# Patient Record
Sex: Female | Born: 1965 | Race: White | Hispanic: Yes | Marital: Married | Smoking: Never smoker
Health system: Southern US, Community
[De-identification: ages and names within clinical notes are randomized; demographics above are authoritative.]

## PROBLEM LIST (undated history)

## (undated) DIAGNOSIS — E119 Type 2 diabetes mellitus without complications: Secondary | ICD-10-CM

## (undated) DIAGNOSIS — D649 Anemia, unspecified: Secondary | ICD-10-CM

## (undated) HISTORY — DX: Anemia, unspecified: D64.9

## (undated) HISTORY — PX: OTHER SURGICAL HISTORY: SHX169

## (undated) HISTORY — DX: Type 2 diabetes mellitus without complications: E11.9

## (undated) HISTORY — PX: CHOLECYSTECTOMY: SHX55

---

## 2004-03-30 ENCOUNTER — Ambulatory Visit: Payer: Self-pay | Admitting: Internal Medicine

## 2008-05-24 ENCOUNTER — Ambulatory Visit: Payer: Self-pay | Admitting: Family Medicine

## 2008-06-06 ENCOUNTER — Ambulatory Visit: Payer: Self-pay | Admitting: Family Medicine

## 2014-01-24 DIAGNOSIS — S62639D Displaced fracture of distal phalanx of unspecified finger, subsequent encounter for fracture with routine healing: Secondary | ICD-10-CM | POA: Insufficient documentation

## 2014-12-07 DIAGNOSIS — H11002 Unspecified pterygium of left eye: Secondary | ICD-10-CM | POA: Insufficient documentation

## 2015-12-12 ENCOUNTER — Inpatient Hospital Stay: Payer: Self-pay | Admitting: Internal Medicine

## 2016-01-03 ENCOUNTER — Encounter: Payer: Self-pay | Admitting: Internal Medicine

## 2016-01-03 ENCOUNTER — Inpatient Hospital Stay: Payer: BLUE CROSS/BLUE SHIELD | Attending: Internal Medicine | Admitting: Internal Medicine

## 2016-01-03 ENCOUNTER — Encounter (INDEPENDENT_AMBULATORY_CARE_PROVIDER_SITE_OTHER): Payer: Self-pay

## 2016-01-03 DIAGNOSIS — E119 Type 2 diabetes mellitus without complications: Secondary | ICD-10-CM | POA: Insufficient documentation

## 2016-01-03 DIAGNOSIS — D500 Iron deficiency anemia secondary to blood loss (chronic): Secondary | ICD-10-CM | POA: Insufficient documentation

## 2016-01-03 DIAGNOSIS — Z7984 Long term (current) use of oral hypoglycemic drugs: Secondary | ICD-10-CM | POA: Insufficient documentation

## 2016-01-03 DIAGNOSIS — N920 Excessive and frequent menstruation with regular cycle: Secondary | ICD-10-CM | POA: Diagnosis not present

## 2016-01-03 DIAGNOSIS — R109 Unspecified abdominal pain: Secondary | ICD-10-CM

## 2016-01-03 NOTE — Assessment & Plan Note (Addendum)
#   likely secondary to heavy periods. Hemoglobin between 8-9 asymptomatic. Ferritin 7; saturations 3%. Recommend IV Venofer weekly 4 discussed the potential reactions.  # Abdominal pain ? / History of menorrhagia deferred to PCP for further workup.   # Labs in 2 Months/ possible venofer in 2 months; Follow up labs/MD/possible venofer in 4 months; labs I week prior.  Thank you Dr. Cathie HoopsYu for allowing me to participate in the care of your pleasant patient. Please do not hesitate to contact me with questions or concerns in the interim.

## 2016-01-03 NOTE — Progress Notes (Signed)
[facility] CONSULT NOTE  Patient Care Team: Rupal Doreatha LewLakhani Yu, MD as PCP - General (Family Medicine)  CHIEF COMPLAINTS/PURPOSE OF CONSULTATION:   # IRON DEFICIENCY ANEMIA [hb- 8-9] sat- 3%; Ferritin- 7; stool occult-NEG  # heavy menstrual periods   No history exists.     HISTORY OF PRESENTING ILLNESS:  Megan Maxwell 50 y.o.  female has been referred to us for further evaluation and recommendations for severe anemia.  Patient admits to chronic anemia; has chronic heavy menstrual periods. Recently started on birth control pills- with some control of the heavy periods. Denies any weight loss. Denies any difficult swallowing. Denies any blood in urine. Denies blood in stools.  Patient has intermittent abdominal pain; around the time of her menstrual periods. No abdominal distention. She is awaiting an ultrasound thru her PCP.   ROS: A complete 10 point review of system is done which is negative except mentioned above in history of present illness  MEDICAL HISTORY:  Past Medical History:  Diagnosis Date  . Anemia   . Diabetes mellitus without complication (HCC)     SURGICAL HISTORY: Past Surgical History:  Procedure Laterality Date  . c-section x2  1991/2001    SOCIAL HISTORY: [employer] and you a [employer]; [place]; no smoking/ no alcohol.  Social History   Social History  . Marital status: Married    Spouse name: N/A  . Number of children: N/A  . Years of education: N/A   Occupational History  . Not on file.   Social History Main Topics  . Smoking status: Not on file  . Smokeless tobacco: Not on file  . Alcohol use Not on file  . Drug use: Unknown  . Sexual activity: Not on file   Other Topics Concern  . Not on file   Social History Narrative  . No narrative on file    FAMILY HISTORY: grandma/ paternal- uterine ca;  No family history on file.  ALLERGIES:  has No Known Allergies.  MEDICATIONS:  Current Outpatient Prescriptions   Medication Sig Dispense Refill  . Cholecalciferol (VITAMIN D3) 2000 units TABS Take by mouth.    . ferrous sulfate 325 (65 FE) MG tablet Take 325 mg by mouth.    Marland Kitchen. ibuprofen (ADVIL,MOTRIN) 800 MG tablet   1  . metFORMIN (GLUCOPHAGE) 500 MG tablet Take 500 mg by mouth.     No current facility-administered medications for this visit.       Marland Kitchen.  PHYSICAL EXAMINATION:   Vitals:   01/03/16 1555  BP: 112/75  Pulse: 61  Resp: 18  Temp: 98 F (36.7 C)   Filed Weights   01/03/16 1555  Weight: 185 lb 1 oz (83.9 kg)    GENERAL: Well-nourished well-developed; Alert, no distress and comfortable.   Accompanied by daughter/ interpreter.  EYES: no icterus; positive for pallor.  OROPHARYNX: no thrush or ulceration; good dentition  NECK: supple, no masses felt LYMPH:  no palpable lymphadenopathy in the cervical, axillary or inguinal regions LUNGS: clear to auscultation and  No wheeze or crackles HEART/CVS: regular rate & rhythm and no murmurs; No lower extremity edema ABDOMEN: abdomen soft, non-tender and normal bowel sounds Musculoskeletal:no cyanosis of digits and no clubbing  PSYCH: alert & oriented x 3 with fluent speech NEURO: no focal motor/sensory deficits SKIN:  no rashes or significant lesions  LABORATORY DATA:  I have reviewed the data as listed No results found for: WBC, HGB, HCT, MCV, PLT No results for input(s): NA,  K, CL, CO2, GLUCOSE, BUN, CREATININE, CALCIUM, GFRNONAA, GFRAA, PROT, ALBUMIN, AST, ALT, ALKPHOS, BILITOT, BILIDIR, IBILI in the last 8760 hours.  RADIOGRAPHIC STUDIES: I have personally reviewed the radiological images as listed and agreed with the findings in the report. No results found.  ASSESSMENT & PLAN:   Iron deficiency anemia due to chronic blood loss # likely secondary to heavy periods. Hemoglobin between 8-9 asymptomatic. Ferritin 7; saturations 3%. Recommend IV Venofer weekly 4 discussed the potential reactions.  # Abdominal pain ? / History  of menorrhagia deferred to PCP for further workup.   # Labs in 2 Months/ possible venofer in 2 months; Follow up labs/MD/possible venofer in 4 months; labs I week prior.  Thank you Dr. Cathie Hoops for allowing me to participate in the care of your pleasant patient. Please do not hesitate to contact me with questions or concerns in the interim.    All questions were answered. The patient knows to call the clinic with any problems, questions or concerns.     Earna Coder, MD 01/03/2016 4:59 PM

## 2016-01-03 NOTE — Progress Notes (Signed)
Patient states she gets pain in upper gastric area that radiates into her back sometimes after she eats.  She also states she gets right sided abdominal pain on occasion.  Patient here as new evaluation regarding anemia.  [facility].

## 2016-01-05 ENCOUNTER — Encounter (INDEPENDENT_AMBULATORY_CARE_PROVIDER_SITE_OTHER): Payer: Self-pay

## 2016-01-05 ENCOUNTER — Inpatient Hospital Stay: Payer: BLUE CROSS/BLUE SHIELD | Attending: Internal Medicine

## 2016-01-05 VITALS — BP 108/72 | HR 67 | Temp 97.2°F | Resp 12

## 2016-01-05 DIAGNOSIS — Z79899 Other long term (current) drug therapy: Secondary | ICD-10-CM | POA: Insufficient documentation

## 2016-01-05 DIAGNOSIS — D500 Iron deficiency anemia secondary to blood loss (chronic): Secondary | ICD-10-CM | POA: Insufficient documentation

## 2016-01-05 MED ORDER — SODIUM CHLORIDE 0.9 % IV SOLN
200.0000 mg | Freq: Once | INTRAVENOUS | Status: AC
  Administered 2016-01-05: 200 mg via INTRAVENOUS
  Filled 2016-01-05: qty 10

## 2016-01-05 MED ORDER — SODIUM CHLORIDE 0.9 % IV SOLN
Freq: Once | INTRAVENOUS | Status: AC
  Administered 2016-01-05: 14:00:00 via INTRAVENOUS
  Filled 2016-01-05: qty 1000

## 2016-01-09 DIAGNOSIS — E119 Type 2 diabetes mellitus without complications: Secondary | ICD-10-CM | POA: Insufficient documentation

## 2016-01-09 DIAGNOSIS — G43009 Migraine without aura, not intractable, without status migrainosus: Secondary | ICD-10-CM | POA: Insufficient documentation

## 2016-01-12 ENCOUNTER — Inpatient Hospital Stay: Payer: BLUE CROSS/BLUE SHIELD

## 2016-01-12 DIAGNOSIS — D500 Iron deficiency anemia secondary to blood loss (chronic): Secondary | ICD-10-CM

## 2016-01-12 MED ORDER — SODIUM CHLORIDE 0.9 % IV SOLN
200.0000 mg | Freq: Once | INTRAVENOUS | Status: AC
  Administered 2016-01-12: 200 mg via INTRAVENOUS
  Filled 2016-01-12: qty 10

## 2016-01-12 MED ORDER — SODIUM CHLORIDE 0.9 % IV SOLN
Freq: Once | INTRAVENOUS | Status: AC
  Administered 2016-01-12: 15:00:00 via INTRAVENOUS
  Filled 2016-01-12: qty 1000

## 2016-01-19 ENCOUNTER — Ambulatory Visit: Payer: Self-pay

## 2016-01-19 ENCOUNTER — Inpatient Hospital Stay: Payer: BLUE CROSS/BLUE SHIELD

## 2016-01-19 VITALS — BP 106/70 | HR 73 | Temp 97.7°F | Resp 18

## 2016-01-19 DIAGNOSIS — D500 Iron deficiency anemia secondary to blood loss (chronic): Secondary | ICD-10-CM

## 2016-01-19 MED ORDER — SODIUM CHLORIDE 0.9 % IV SOLN
200.0000 mg | Freq: Once | INTRAVENOUS | Status: AC
  Administered 2016-01-19: 200 mg via INTRAVENOUS
  Filled 2016-01-19: qty 10

## 2016-01-19 MED ORDER — SODIUM CHLORIDE 0.9 % IV SOLN
Freq: Once | INTRAVENOUS | Status: AC
  Administered 2016-01-19: 14:00:00 via INTRAVENOUS
  Filled 2016-01-19: qty 1000

## 2016-01-26 ENCOUNTER — Inpatient Hospital Stay: Payer: BLUE CROSS/BLUE SHIELD

## 2016-01-26 VITALS — BP 110/69 | HR 76 | Resp 20

## 2016-01-26 DIAGNOSIS — D500 Iron deficiency anemia secondary to blood loss (chronic): Secondary | ICD-10-CM

## 2016-01-26 MED ORDER — SODIUM CHLORIDE 0.9 % IV SOLN
Freq: Once | INTRAVENOUS | Status: AC
  Administered 2016-01-26: 14:00:00 via INTRAVENOUS
  Filled 2016-01-26: qty 1000

## 2016-01-26 MED ORDER — SODIUM CHLORIDE 0.9 % IV SOLN
200.0000 mg | Freq: Once | INTRAVENOUS | Status: AC
  Administered 2016-01-26: 200 mg via INTRAVENOUS
  Filled 2016-01-26: qty 10

## 2016-03-01 ENCOUNTER — Inpatient Hospital Stay: Payer: BLUE CROSS/BLUE SHIELD | Attending: Internal Medicine

## 2016-03-01 ENCOUNTER — Inpatient Hospital Stay: Payer: BLUE CROSS/BLUE SHIELD

## 2016-04-26 ENCOUNTER — Other Ambulatory Visit: Payer: Self-pay

## 2016-05-03 ENCOUNTER — Inpatient Hospital Stay: Payer: BLUE CROSS/BLUE SHIELD

## 2016-05-03 ENCOUNTER — Other Ambulatory Visit: Payer: Self-pay

## 2016-05-10 ENCOUNTER — Ambulatory Visit: Payer: BLUE CROSS/BLUE SHIELD | Admitting: Oncology

## 2016-05-10 ENCOUNTER — Ambulatory Visit: Payer: BLUE CROSS/BLUE SHIELD

## 2016-10-01 DIAGNOSIS — K8020 Calculus of gallbladder without cholecystitis without obstruction: Secondary | ICD-10-CM | POA: Insufficient documentation

## 2017-08-01 ENCOUNTER — Other Ambulatory Visit: Payer: Self-pay | Admitting: Family Medicine

## 2017-08-01 DIAGNOSIS — Z1231 Encounter for screening mammogram for malignant neoplasm of breast: Secondary | ICD-10-CM

## 2018-03-18 ENCOUNTER — Other Ambulatory Visit: Payer: Self-pay | Admitting: Family Medicine

## 2018-03-18 DIAGNOSIS — Z1231 Encounter for screening mammogram for malignant neoplasm of breast: Secondary | ICD-10-CM

## 2019-09-01 ENCOUNTER — Ambulatory Visit: Payer: Self-pay

## 2019-09-01 ENCOUNTER — Other Ambulatory Visit: Payer: Self-pay

## 2019-09-01 DIAGNOSIS — Z021 Encounter for pre-employment examination: Secondary | ICD-10-CM

## 2019-09-01 LAB — POCT URINE DRUG SCREEN
POC Amphetamine UR: NOT DETECTED
POC Cocaine UR: NOT DETECTED
POC Methamphetamine UR: NOT DETECTED
POC Opiate Ur: NOT DETECTED
POC PHENCYCLIDINE UR: NOT DETECTED
URINE TEMPERATURE: 94 Degrees F (ref 90.0–100.0)

## 2019-09-06 NOTE — Addendum Note (Signed)
Addended by: Buzzy Han T on: 09/06/2019 09:12 AM   Modules accepted: Orders

## 2019-09-21 ENCOUNTER — Ambulatory Visit: Payer: Self-pay | Attending: Internal Medicine

## 2019-09-21 DIAGNOSIS — Z23 Encounter for immunization: Secondary | ICD-10-CM

## 2019-09-21 NOTE — Progress Notes (Signed)
   Covid-19 Vaccination Clinic  Name:  TANETTE CHAUCA    MRN: [id number] DOB: [birth year]  09/21/2019  Ms. Donahey was observed post Covid-19 immunization for 15 minutes without incident. She was provided with Vaccine Information Sheet and instruction to access the V-Safe system.   Ms. Ober was instructed to call 911 with any severe reactions post vaccine: Marland Kitchen Difficulty breathing  . Swelling of face and throat  . A fast heartbeat  . A bad rash all over body  . Dizziness and weakness   Immunizations Administered    Name Date Dose VIS Date Route   Pfizer COVID-19 Vaccine 09/21/2019  2:30 PM 0.3 mL 06/30/2018 Intramuscular   Manufacturer: [employer], [employer]   Lot: [id number]   NDC: [address]-6

## 2019-10-07 ENCOUNTER — Other Ambulatory Visit: Payer: Self-pay | Admitting: Family Medicine

## 2019-10-07 DIAGNOSIS — Z1231 Encounter for screening mammogram for malignant neoplasm of breast: Secondary | ICD-10-CM

## 2019-10-07 DIAGNOSIS — R1011 Right upper quadrant pain: Secondary | ICD-10-CM

## 2019-10-21 ENCOUNTER — Other Ambulatory Visit: Payer: Self-pay

## 2019-10-21 ENCOUNTER — Ambulatory Visit
Admission: RE | Admit: 2019-10-21 | Discharge: 2019-10-21 | Disposition: A | Discharge status: Home / Self Care | Payer: BLUE CROSS/BLUE SHIELD | Source: Ambulatory Visit | Attending: Family Medicine | Admitting: Family Medicine

## 2019-10-21 DIAGNOSIS — R1011 Right upper quadrant pain: Secondary | ICD-10-CM | POA: Diagnosis not present

## 2019-10-22 ENCOUNTER — Ambulatory Visit
Admission: RE | Admit: 2019-10-22 | Discharge: 2019-10-22 | Disposition: A | Discharge status: Home / Self Care | Payer: BLUE CROSS/BLUE SHIELD | Source: Ambulatory Visit | Attending: Family Medicine | Admitting: Family Medicine

## 2019-10-22 ENCOUNTER — Encounter: Payer: Self-pay | Admitting: Radiology

## 2019-10-22 DIAGNOSIS — Z1231 Encounter for screening mammogram for malignant neoplasm of breast: Secondary | ICD-10-CM | POA: Diagnosis present

## 2019-10-22 LAB — COLOGUARD: COLOGUARD: NEGATIVE

## 2019-11-10 ENCOUNTER — Other Ambulatory Visit: Payer: Self-pay | Admitting: Family Medicine

## 2019-11-10 DIAGNOSIS — N6320 Unspecified lump in the left breast, unspecified quadrant: Secondary | ICD-10-CM

## 2019-11-10 DIAGNOSIS — R928 Other abnormal and inconclusive findings on diagnostic imaging of breast: Secondary | ICD-10-CM

## 2019-11-10 DIAGNOSIS — N6310 Unspecified lump in the right breast, unspecified quadrant: Secondary | ICD-10-CM

## 2019-11-17 ENCOUNTER — Ambulatory Visit
Admission: RE | Admit: 2019-11-17 | Discharge: 2019-11-17 | Disposition: A | Discharge status: Home / Self Care | Payer: BLUE CROSS/BLUE SHIELD | Source: Ambulatory Visit | Attending: Family Medicine | Admitting: Family Medicine

## 2019-11-17 DIAGNOSIS — R928 Other abnormal and inconclusive findings on diagnostic imaging of breast: Secondary | ICD-10-CM | POA: Diagnosis not present

## 2019-11-17 DIAGNOSIS — N6310 Unspecified lump in the right breast, unspecified quadrant: Secondary | ICD-10-CM

## 2019-11-17 DIAGNOSIS — N6320 Unspecified lump in the left breast, unspecified quadrant: Secondary | ICD-10-CM | POA: Insufficient documentation

## 2019-11-18 ENCOUNTER — Other Ambulatory Visit: Payer: Self-pay

## 2019-11-19 ENCOUNTER — Other Ambulatory Visit: Payer: Self-pay

## 2019-11-19 ENCOUNTER — Encounter: Payer: Self-pay | Admitting: Gastroenterology

## 2019-11-19 ENCOUNTER — Ambulatory Visit (INDEPENDENT_AMBULATORY_CARE_PROVIDER_SITE_OTHER): Payer: BLUE CROSS/BLUE SHIELD | Admitting: Gastroenterology

## 2019-11-19 ENCOUNTER — Encounter: Payer: Self-pay | Admitting: *Deleted

## 2019-11-19 VITALS — BP 125/78 | HR 76 | Temp 97.4°F | Ht 65.0 in | Wt 174.4 lb

## 2019-11-19 DIAGNOSIS — Z9049 Acquired absence of other specified parts of digestive tract: Secondary | ICD-10-CM | POA: Diagnosis not present

## 2019-11-19 DIAGNOSIS — K625 Hemorrhage of anus and rectum: Secondary | ICD-10-CM | POA: Diagnosis not present

## 2019-11-19 DIAGNOSIS — D500 Iron deficiency anemia secondary to blood loss (chronic): Secondary | ICD-10-CM

## 2019-11-19 MED ORDER — NA SULFATE-K SULFATE-MG SULF 17.5-3.13-1.6 GM/177ML PO SOLN: 354.0000 mL | Freq: Once | ORAL | 0 refills | Status: AC

## 2019-11-19 NOTE — Progress Notes (Signed)
Megan Repress, MD [address]  [address]  [place], [place] [address]  Main: [phone]  Fax: [phone]    Gastroenterology Consultation  Referring Provider:     Zandra Abts, MD Primary Care Physician:  Megan Abts, MD Primary Gastroenterologist:  Dr. Arlyss Maxwell Reason for Consultation:     Rectal bleeding, iron deficiency anemia        HPI:   Megan Maxwell is a 54 y.o. female referred by Dr. Cathie Maxwell, Megan Cords, MD  for consultation & management of rectal bleeding, iron deficiency anemia  Patient has history of chronic iron deficiency anemia, which was thought to be secondary to heavy menstrual cycles.  Patient was seen by Dr. Donneta Maxwell in 2017, received IV iron. Since then she did not have follow-up for iron deficiency anemia.  Patient attained menopause at age of 29.  Most recent hemoglobin was still low at 11.3 on 11/04/2019, performed by her PCP.  She is taking over-the-counter iron supplement daily.  She reports one episode of bright red blood per rectum about a month ago, noticed on wiping as well as in the toilet bowl.  She is no longer experiencing rectal bleeding.  She reports having regular bowel habits.  She had history of cholecystectomy, and does report mild right sided abdominal discomfort intermittently.  She has history of diabetes, on Metformin.  She denies any other GI symptoms.  She denies any weight loss.  Patient is accompanied by her daughter today  She denies tobacco and alcohol use  NSAIDs: None  Antiplts/Anticoagulants/Anti thrombotics: None  GI Procedures: None She denies family history of GI malignancy  Past Medical History:  Diagnosis Date  . Anemia   . Diabetes mellitus without complication [facility])     Past Surgical History:  Procedure Laterality Date  . c-section x2  1991/2001    Current Outpatient Medications:  .  Cholecalciferol (VITAMIN D3) 2000 units TABS, Take by mouth., Disp: , Rfl:  .  ferrous sulfate 325  (65 FE) MG tablet, Take 325 mg by mouth., Disp: , Rfl:  .  ibuprofen (ADVIL,MOTRIN) 800 MG tablet, , Disp: , Rfl: 1 .  losartan (COZAAR) 25 MG tablet, Take 25 mg by mouth daily., Disp: , Rfl:  .  lovastatin (MEVACOR) 40 MG tablet, SMARTSIG:1 Tablet(s) By Mouth Every Evening, Disp: , Rfl:  .  metFORMIN (GLUCOPHAGE) 1000 MG tablet, Take 1,000 mg by mouth 2 (two) times daily., Disp: , Rfl:  .  Multiple Vitamin (MULTIVITAMIN) capsule, Take by mouth., Disp: , Rfl:  .  ondansetron (ZOFRAN-ODT) 4 MG disintegrating tablet, Take 4 mg by mouth every 8 (eight) hours as needed., Disp: , Rfl:  .  tamsulosin (FLOMAX) 0.4 MG CAPS capsule, Take 0.4 mg by mouth daily., Disp: , Rfl:  .  traZODone (DESYREL) 50 MG tablet, TAKE ONE HALF TO 2 TABLETS BY MOUTH AS NEEDED FOR DIFFICULTY SLEEPING., Disp: , Rfl:  .  Na Sulfate-K Sulfate-Mg Sulf 17.5-3.13-1.6 GM/177ML SOLN, Take 354 mLs by mouth once for 1 dose., Disp: 354 mL, Rfl: 0   Family History  Problem Relation Age of Onset  . Breast cancer Neg Hx      Social History   Tobacco Use  . Smoking status: Never Smoker  . Smokeless tobacco: Never Used  Vaping Use  . Vaping Use: Never used  Substance Use Topics  . Alcohol use: Never  . Drug use: Never    Allergies as of 11/19/2019  . (No Known Allergies)  Review of Systems:    All systems reviewed and negative except where noted in HPI.   Physical Exam:  BP 125/78 (BP Location: Left Arm, Patient Position: Sitting, Cuff Size: Normal)   Pulse 76   Temp (!) 97.4 F (36.3 C) (Oral)   Ht 5\' 5"  (1.651 m)   Wt 174 lb 6 oz (79.1 kg)   BMI 29.02 kg/m  No LMP recorded. Patient is postmenopausal.  General:   Alert,  Well-developed, well-nourished, pleasant and cooperative in NAD Head:  Normocephalic and atraumatic. Eyes:  Sclera clear, no icterus.   Conjunctiva pink. Ears:  Normal auditory acuity. Nose:  No deformity, discharge, or lesions. Mouth:  No deformity or lesions,oropharynx pink &  moist. Neck:  Supple; no masses or thyromegaly. Lungs:  Respirations even and unlabored.  Clear throughout to auscultation.   No wheezes, crackles, or rhonchi. No acute distress. Heart:  Regular rate and rhythm; no murmurs, clicks, rubs, or gallops. Abdomen:  Normal bowel sounds. Soft, non-tender and non-distended without masses, hepatosplenomegaly or hernias noted.  No guarding or rebound tenderness.   Rectal: Not performed Msk:  Symmetrical without gross deformities. Good, equal movement & strength bilaterally. Pulses:  Normal pulses noted. Extremities:  No clubbing or edema.  No cyanosis. Neurologic:  Alert and oriented x3;  grossly normal neurologically. Skin:  Intact without significant lesions or rashes. No jaundice. none Psych:  Alert and cooperative. Normal mood and affect.  Imaging Studies: None  Assessment and Plan:   Megan Maxwell is a 54 y.o. Spanish-speaking female with history of diabetes, iron deficiency anemia is seen in consultation for bright red blood per rectum  Rectal blood per rectum, self-limited episode Recommend colonoscopy for further evaluation  Iron deficiency anemia Patient had attained menopause, however still anemic Recommend bidirectional endoscopy for further evaluation +/- VCE Recheck CBC, iron panel and B12 and folate panel today   Follow up in 2 to 3 months   44, MD

## 2019-11-20 LAB — IRON,TIBC AND FERRITIN PANEL
Ferritin: 33 ng/mL (ref 15–150)
Iron Saturation: 22 % (ref 15–55)
Iron: 63 ug/dL (ref 27–159)
Total Iron Binding Capacity: 288 ug/dL (ref 250–450)
UIBC: 225 ug/dL (ref 131–425)

## 2019-11-20 LAB — COMPREHENSIVE METABOLIC PANEL
ALT: 14 IU/L (ref 0–32)
AST: 16 IU/L (ref 0–40)
Albumin/Globulin Ratio: 1.5 (ref 1.2–2.2)
Albumin: 3.9 g/dL (ref 3.8–4.9)
Alkaline Phosphatase: 110 IU/L (ref 48–121)
BUN/Creatinine Ratio: 17 (ref 9–23)
BUN: 10 mg/dL (ref 6–24)
Bilirubin Total: <0.2 mg/dL (ref 0.0–1.2)
CO2: 23 mmol/L (ref 20–29)
Calcium: 9.7 mg/dL (ref 8.7–10.2)
Chloride: 102 mmol/L (ref 96–106)
Creatinine, Ser: 0.58 mg/dL (ref 0.57–1.00)
GFR calc Af Amer: 123 mL/min/{1.73_m2} (ref >59)
GFR calc non Af Amer: 106 mL/min/{1.73_m2} (ref >59)
Globulin, Total: 2.6 g/dL (ref 1.5–4.5)
Glucose: 196 mg/dL — ABNORMAL HIGH (ref 65–99)
Potassium: 4.9 mmol/L (ref 3.5–5.2)
Sodium: 141 mmol/L (ref 134–144)
Total Protein: 6.5 g/dL (ref 6.0–8.5)

## 2019-11-20 LAB — B12 AND FOLATE PANEL
Folate: >20 ng/mL (ref >3.0)
Vitamin B-12: 648 pg/mL (ref 232–1245)

## 2019-11-20 LAB — CBC
Hematocrit: 33.1 % — ABNORMAL LOW (ref 34.0–46.6)
Hemoglobin: 11.2 g/dL (ref 11.1–15.9)
MCH: 29.8 pg (ref 26.6–33.0)
MCHC: 33.8 g/dL (ref 31.5–35.7)
MCV: 88 fL (ref 79–97)
Platelets: 386 10*3/uL (ref 150–450)
RBC: 3.76 x10E6/uL — ABNORMAL LOW (ref 3.77–5.28)
RDW: 13.4 % (ref 11.7–15.4)
WBC: 6 10*3/uL (ref 3.4–10.8)

## 2019-11-22 ENCOUNTER — Telehealth: Payer: Self-pay

## 2019-11-22 NOTE — Telephone Encounter (Signed)
Called using the interpreter services and she verbalized understanding

## 2019-11-22 NOTE — Telephone Encounter (Signed)
-----   Message from Toney Reil, MD sent at 11/20/2019  3:26 PM EDT ----- Her labs came back normal  RV

## 2019-11-22 NOTE — Progress Notes (Signed)
11/23/2019 12:48 PM   Megan Maxwell August 07, 1965 [id number]  Referring provider: Zandra Abts, MD [address] [place],  [place] [address] Chief Complaint  Patient presents with  . Hematuria    New Patient  . Dysuria    HPI: Megan Maxwell is a 54 y.o. female who presents today for evaluation and management for painful urination, right flank pain, and hematuria.   [facility] thought she may have had a kidney stone. No Cipro or flomax was prescribed.   The patient was seen by Dr. Marvis Moeller on 11/04/2019. She had right flank pain that started 5 days prior. Pain radiated to groin. Abdominal pain was dull. She reported hematuria. She had an episode of emesis on 11/03/2019. Her appetite had decreased. She felt lightheaded. She denied any fevers. She was put on Cipro and encouraged to continue Flomax. She was placed on Zofran. She was encouraged to push fluids and avoid caffeine.   She only had 1 episode of emesis. She reports that she did run a fever. Her nausea is well managed with Zofran. She reports that Urgent Care prescribed her cipro, flomax and zofran. Providers at [facility] advised her to continue medication, avoid tea and caffiene. She reports that she had imaging done x 1 month ago.    UA showed moderate blood, WBC 4-10, RBC 4-10, few bacteria and many squamous epithelial cells. There was no associating urine culture. Patient was believed to have a possible right ureteral stone and right kidney cyst.   There is no imaging available at this time.   No history of kidney stones.   She feels like the right kidney cyst is related to her back pain. 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   PMH: Past Medical History:  Diagnosis Date  . Anemia   . Diabetes mellitus without complication [facility])     Surgical History: Past Surgical History:  Procedure Laterality Date  . c-section x2  1991/2001     Home Medications:  Allergies as of 11/23/2019   No Known Allergies     Medication List       Accurate as of November 23, 2019 12:48 PM. If you have any questions, ask your nurse or doctor.        STOP taking these medications   ferrous sulfate 325 (65 FE) MG tablet Stopped by: Vanna Scotland, MD   ibuprofen 800 MG tablet Commonly known as: ADVIL Stopped by: Vanna Scotland, MD   multivitamin capsule Stopped by: Vanna Scotland, MD   tamsulosin 0.4 MG Caps capsule Commonly known as: FLOMAX Stopped by: Vanna Scotland, MD   traZODone 50 MG tablet Commonly known as: DESYREL Stopped by: Vanna Scotland, MD   Vitamin D3 50 MCG (2000 UT) Tabs Stopped by: Vanna Scotland, MD     TAKE these medications   losartan 25 MG tablet Commonly known as: COZAAR Take 25 mg by mouth daily.   lovastatin 40 MG tablet Commonly known as: MEVACOR SMARTSIG:1 Tablet(s) By Mouth Every Evening   metFORMIN 1000 MG tablet Commonly known as: GLUCOPHAGE Take 1,000 mg by mouth 2 (two) times daily.   ondansetron 4 MG disintegrating tablet Commonly known as: ZOFRAN-ODT Take 4 mg by mouth every 8 (eight) hours as needed.       Allergies: No Known Allergies  Family History: Family History  Problem Relation Age of Onset  . Breast cancer Neg Hx   .  Kidney cancer Neg Hx   . Prostate cancer Neg Hx   . Bladder Cancer Neg Hx     Social History:  reports that she has never smoked. She has never used smokeless tobacco. She reports that she does not drink alcohol and does not use drugs.   Physical Exam: BP 136/71   Pulse 73   Ht 5\' 5"  (1.651 m)   Wt 175 lb (79.4 kg)   BMI 29.12 kg/m   Constitutional:  Alert and oriented, No acute distress.  Accompanied today by translator. HEENT: [place] AT, moist mucus membranes.  Trachea midline, no masses. Cardiovascular: No clubbing, cyanosis, or edema. Respiratory: Normal respiratory effort, no increased work of breathing. Skin: No rashes,  bruises or suspicious lesions. Neurologic: Grossly intact, no focal deficits, moving all 4 extremities. Psychiatric: Normal mood and affect.  Laboratory Data:  Lab Results  Component Value Date   CREATININE 0.58 11/19/2019    Urinalysis Showed no blood  Pertinent Imaging: CLINICAL DATA:  Right upper quadrant abdominal pain  EXAM: ABDOMEN ULTRASOUND COMPLETE  COMPARISON:  None.  FINDINGS: Gallbladder: Surgically absent  Common bile duct: Diameter: 4.8 mm  Liver: No focal lesion identified. Within normal limits in parenchymal echogenicity. Portal vein is patent on color Doppler imaging with normal direction of blood flow towards the liver.  IVC: No abnormality visualized.  Pancreas: Visualized portion unremarkable.  Spleen: Size and appearance within normal limits.  Right Kidney: Length: 11.4 cm. Cyst in the midpole measuring 3 x 2.6 x 2 cm.  Left Kidney: Length: 10.9 cm. Echogenicity within normal limits. No mass or hydronephrosis visualized.  Abdominal aorta: No aneurysm visualized.  Other findings: None.  IMPRESSION: 1. Status post cholecystectomy.  No biliary dilatation 2. 3 cm simple right renal cyst   Electronically Signed   By: 11/21/2019 M.D.   On: 10/21/2019 16:24   I have personally reviewed the images and agree with radiologist interpretation.    Assessment & Plan:    1. Gross/microscopic hematuria Based on symptoms at the time, at most likely this is secondary to an acute stone episode.  Given no significant stone burden on renal ultrasound, suspect this is likely very small stone.  Urinalysis today is negative, no evidence of ongoing microscopic hematuria which is reassuring   2. Right flank pain/nephrolithiasis  Stone episode resolved.   We discussed general stone prevention techniques including drinking plenty water with goal of producing 2.5 L urine daily, increased citric acid intake, avoidance of high oxalate  containing foods, and decreased salt intake.  Information about dietary recommendations given today.   Given information in spanish  Advised if she has signs or symptoms of recurrent stone episode, would recommend CT scan versus KUB to assess stone burden.  Renal ultrasound without a significant stone burden.  3. Right kidney cyst   Stable, no surveillance needed  Follow up as needed.  Return if symptoms worsen or fail to improve.  [facility] [address], [address] [place], [place] [phone]  I, [phone], am acting as a scribe for Dr. Theador Hawthorne.  I have reviewed the above documentation for accuracy and completeness, and I agree with the above.   Vanna Scotland, MD  I spent 45 total minutes on the day of the encounter including pre-visit review of the medical record, face-to-face time with the patient, and post visit ordering of labs/imaging/tests.

## 2019-11-23 ENCOUNTER — Ambulatory Visit: Payer: BLUE CROSS/BLUE SHIELD | Admitting: Urology

## 2019-11-23 ENCOUNTER — Encounter: Payer: Self-pay | Admitting: Urology

## 2019-11-23 ENCOUNTER — Other Ambulatory Visit: Payer: Self-pay

## 2019-11-23 VITALS — BP 136/71 | HR 73 | Ht 65.0 in | Wt 175.0 lb

## 2019-11-23 DIAGNOSIS — R319 Hematuria, unspecified: Secondary | ICD-10-CM | POA: Diagnosis not present

## 2019-11-23 LAB — URINALYSIS, COMPLETE
Bilirubin, UA: NEGATIVE
Glucose, UA: NEGATIVE
Ketones, UA: NEGATIVE
Leukocytes,UA: NEGATIVE
Nitrite, UA: NEGATIVE
Protein,UA: NEGATIVE
RBC, UA: NEGATIVE
Specific Gravity, UA: 1.01 (ref 1.005–1.030)
Urobilinogen, Ur: 0.2 mg/dL (ref 0.2–1.0)
pH, UA: 6 (ref 5.0–7.5)

## 2019-11-23 LAB — MICROSCOPIC EXAMINATION: Bacteria, UA: NONE SEEN

## 2019-12-10 ENCOUNTER — Other Ambulatory Visit
Admission: RE | Admit: 2019-12-10 | Discharge: 2019-12-10 | Disposition: A | Discharge status: Home / Self Care | Payer: BLUE CROSS/BLUE SHIELD | Source: Ambulatory Visit | Attending: Gastroenterology | Admitting: Gastroenterology

## 2019-12-10 ENCOUNTER — Other Ambulatory Visit: Payer: Self-pay

## 2019-12-10 DIAGNOSIS — Z20822 Contact with and (suspected) exposure to covid-19: Secondary | ICD-10-CM | POA: Insufficient documentation

## 2019-12-10 DIAGNOSIS — Z01812 Encounter for preprocedural laboratory examination: Secondary | ICD-10-CM | POA: Diagnosis not present

## 2019-12-10 LAB — SARS CORONAVIRUS 2 (TAT 6-24 HRS): SARS Coronavirus 2: NEGATIVE

## 2019-12-14 ENCOUNTER — Ambulatory Visit: Payer: BLUE CROSS/BLUE SHIELD | Admitting: Anesthesiology

## 2019-12-14 ENCOUNTER — Ambulatory Visit
Admission: RE | Admit: 2019-12-14 | Discharge: 2019-12-14 | Disposition: A | Discharge status: Home / Self Care | Payer: BLUE CROSS/BLUE SHIELD | Attending: Gastroenterology | Admitting: Gastroenterology

## 2019-12-14 ENCOUNTER — Encounter: Payer: Self-pay | Admitting: Gastroenterology

## 2019-12-14 ENCOUNTER — Other Ambulatory Visit: Payer: Self-pay

## 2019-12-14 ENCOUNTER — Encounter
Admission: RE | Disposition: A | Discharge status: Home / Self Care | Payer: Self-pay | Source: Home / Self Care | Attending: Gastroenterology

## 2019-12-14 DIAGNOSIS — D509 Iron deficiency anemia, unspecified: Secondary | ICD-10-CM | POA: Insufficient documentation

## 2019-12-14 DIAGNOSIS — K2940 Chronic atrophic gastritis without bleeding: Secondary | ICD-10-CM | POA: Insufficient documentation

## 2019-12-14 DIAGNOSIS — Z79899 Other long term (current) drug therapy: Secondary | ICD-10-CM | POA: Insufficient documentation

## 2019-12-14 DIAGNOSIS — Z7984 Long term (current) use of oral hypoglycemic drugs: Secondary | ICD-10-CM | POA: Insufficient documentation

## 2019-12-14 DIAGNOSIS — E119 Type 2 diabetes mellitus without complications: Secondary | ICD-10-CM | POA: Insufficient documentation

## 2019-12-14 DIAGNOSIS — K2980 Duodenitis without bleeding: Secondary | ICD-10-CM | POA: Insufficient documentation

## 2019-12-14 DIAGNOSIS — D500 Iron deficiency anemia secondary to blood loss (chronic): Secondary | ICD-10-CM | POA: Diagnosis not present

## 2019-12-14 HISTORY — PX: COLONOSCOPY WITH PROPOFOL: SHX5780

## 2019-12-14 HISTORY — PX: ESOPHAGOGASTRODUODENOSCOPY (EGD) WITH PROPOFOL: SHX5813

## 2019-12-14 LAB — GLUCOSE, CAPILLARY: Glucose-Capillary: 153 mg/dL — ABNORMAL HIGH (ref 70–99)

## 2019-12-14 SURGERY — COLONOSCOPY WITH PROPOFOL
Anesthesia: General

## 2019-12-14 MED ORDER — LIDOCAINE HCL (CARDIAC) PF 100 MG/5ML IV SOSY
PREFILLED_SYRINGE | INTRAVENOUS | Status: DC | PRN
  Administered 2019-12-14: 50 mg via INTRAVENOUS

## 2019-12-14 MED ORDER — PROPOFOL 500 MG/50ML IV EMUL
INTRAVENOUS | Status: DC | PRN
  Administered 2019-12-14: 150 ug/kg/min via INTRAVENOUS

## 2019-12-14 MED ORDER — PROPOFOL 500 MG/50ML IV EMUL
INTRAVENOUS | Status: AC
  Filled 2019-12-14: qty 50

## 2019-12-14 MED ORDER — SODIUM CHLORIDE 0.9 % IV SOLN: INTRAVENOUS | Status: DC

## 2019-12-14 MED ORDER — PROPOFOL 10 MG/ML IV BOLUS
INTRAVENOUS | Status: AC
  Filled 2019-12-14: qty 20

## 2019-12-14 NOTE — Anesthesia Preprocedure Evaluation (Signed)
Anesthesia Evaluation  Patient identified by MRN, date of birth, ID band Patient awake    Reviewed: Allergy & Precautions, H&P , NPO status , Patient's Chart, lab work & pertinent test results  History of Anesthesia Complications Negative for: history of anesthetic complications  Airway Mallampati: II  TM Distance: >3 FB     Dental   Pulmonary neg pulmonary ROS, neg sleep apnea, neg COPD,    breath sounds clear to auscultation       Cardiovascular (-) angina(-) Past MI and (-) Cardiac Stents negative cardio ROS  (-) dysrhythmias  Rhythm:regular Rate:Normal     Neuro/Psych  Headaches, negative psych ROS   GI/Hepatic negative GI ROS, Neg liver ROS,   Endo/Other  diabetes  Renal/GU negative Renal ROS  negative genitourinary   Musculoskeletal   Abdominal   Peds  Hematology negative hematology ROS (+)   Anesthesia Other Findings Past Medical History: No date: Anemia No date: Diabetes mellitus without complication (HCC)  Past Surgical History: 1991/2001: c-section x2 No date: CHOLECYSTECTOMY  BMI    Body Mass Index: 28.96 kg/m      Reproductive/Obstetrics negative OB ROS                             Anesthesia Physical Anesthesia Plan  ASA: II  Anesthesia Plan: General   Post-op Pain Management:    Induction:   PONV Risk Score and Plan: Propofol infusion and TIVA  Airway Management Planned: Nasal Cannula  Additional Equipment:   Intra-op Plan:   Post-operative Plan:   Informed Consent: I have reviewed the patients History and Physical, chart, labs and discussed the procedure including the risks, benefits and alternatives for the proposed anesthesia with the patient or authorized representative who has indicated his/her understanding and acceptance.     Dental Advisory Given  Plan Discussed with: Anesthesiologist, CRNA and Surgeon  Anesthesia Plan Comments:          Anesthesia Quick Evaluation

## 2019-12-14 NOTE — Anesthesia Procedure Notes (Signed)
Performed by: Cook-Martin, Yaris Ferrell Pre-anesthesia Checklist: Patient identified, Emergency Drugs available, Suction available, Patient being monitored and Timeout performed Patient Re-evaluated:Patient Re-evaluated prior to induction Oxygen Delivery Method: Nasal cannula Preoxygenation: Pre-oxygenation with 100% oxygen Induction Type: IV induction Airway Equipment and Method: Bite block Placement Confirmation: positive ETCO2 and CO2 detector       

## 2019-12-14 NOTE — Transfer of Care (Signed)
Immediate Anesthesia Transfer of Care Note  Patient: Megan Maxwell  Procedure(s) Performed: COLONOSCOPY WITH PROPOFOL (N/A ) ESOPHAGOGASTRODUODENOSCOPY (EGD) WITH PROPOFOL (N/A )  Patient Location: PACU  Anesthesia Type:General  Level of Consciousness: awake and sedated  Airway & Oxygen Therapy: Patient Spontanous Breathing and Patient connected to nasal cannula oxygen  Post-op Assessment: Report given to RN and Post -op Vital signs reviewed and stable  Post vital signs: Reviewed and stable  Last Vitals:  Vitals Value Taken Time  BP    Temp    Pulse    Resp    SpO2      Last Pain:  Vitals:   12/14/19 0846  TempSrc: Temporal  PainSc: 0-No pain         Complications: No complications documented.

## 2019-12-14 NOTE — Op Note (Signed)
[facility] Gastroenterology Patient Name: Megan Maxwell Procedure Date: 12/14/2019 10:02 AM MRN: [id number] Account #: [id number] Date of Birth: 02/04/1966 Admit Type: Outpatient Age: 54 Room: [facility] ENDO ROOM 1 Gender: Female Note Status: Finalized Procedure:             Colonoscopy Indications:           Unexplained iron deficiency anemia Providers:             Lin Landsman MD, MD Referring MD:          Rupal L. Tasia Catchings MD (Referring MD) Medicines:             Monitored Anesthesia Care Complications:         No immediate complications. Estimated blood loss: None. Procedure:             Pre-Anesthesia Assessment:                        - Prior to the procedure, a History and Physical was                         performed, and patient medications and allergies were                         reviewed. The patient is competent. The risks and                         benefits of the procedure and the sedation options and                         risks were discussed with the patient. All questions                         were answered and informed consent was obtained.                         Patient identification and proposed procedure were                         verified by the physician, the nurse, the                         anesthesiologist, the anesthetist and the technician                         in the pre-procedure area in the procedure room in the                         endoscopy suite. Mental Status Examination: alert and                         oriented. Airway Examination: normal oropharyngeal                         airway and neck mobility. Respiratory Examination:                         clear to auscultation. CV Examination: normal.  Prophylactic Antibiotics: The patient does not require                         prophylactic antibiotics. Prior Anticoagulants: The                         patient has taken no previous  anticoagulant or                         antiplatelet agents. ASA Grade Assessment: II - A                         patient with mild systemic disease. After reviewing                         the risks and benefits, the patient was deemed in                         satisfactory condition to undergo the procedure. The                         anesthesia plan was to use monitored anesthesia care                         (MAC). Immediately prior to administration of                         medications, the patient was re-assessed for adequacy                         to receive sedatives. The heart rate, respiratory                         rate, oxygen saturations, blood pressure, adequacy of                         pulmonary ventilation, and response to care were                         monitored throughout the procedure. The physical                         status of the patient was re-assessed after the                         procedure.                        After obtaining informed consent, the colonoscope was                         passed under direct vision. Throughout the procedure,                         the patient's blood pressure, pulse, and oxygen                         saturations were monitored continuously. The  Colonoscope was introduced through the anus and                         advanced to the 20 cm into the ileum. The colonoscopy                         was performed without difficulty. The patient                         tolerated the procedure well. The quality of the bowel                         preparation was evaluated using the BBPS [facility] Bowel                         Preparation Scale) with scores of: Right Colon = 3,                         Transverse Colon = 3 and Left Colon = 3 (entire mucosa                         seen well with no residual staining, small fragments                         of stool or opaque liquid). The total BBPS  score                         equals 9. Findings:      The perianal and digital rectal examinations were normal. Pertinent       negatives include normal sphincter tone and no palpable rectal lesions.      The terminal ileum appeared normal.      The entire examined colon appeared normal.      The retroflexed view of the distal rectum and anal verge was normal and       showed no anal or rectal abnormalities. Impression:            - The examined portion of the ileum was normal.                        - The entire examined colon is normal.                        - The distal rectum and anal verge are normal on                         retroflexion view.                        - No specimens collected. Recommendation:        - Discharge patient to home (with escort).                        - Resume previous diet today.                        - Continue present medications.                        -  Repeat colonoscopy in 10 years for surveillance. Procedure Code(s):     --- Professional ---                        [id number], Colonoscopy, flexible; diagnostic, including                         collection of specimen(s) by brushing or washing, when                         performed (separate procedure) Diagnosis Code(s):     --- Professional ---                        D50.9, Iron deficiency anemia, unspecified CPT copyright 2019 American Medical Association. All rights reserved. The codes documented in this report are preliminary and upon coder review may  be revised to meet current compliance requirements. Dr. Ulyess Mort Lin Landsman MD, MD 12/14/2019 10:29:57 AM This report has been signed electronically. Number of Addenda: 0 Note Initiated On: 12/14/2019 10:02 AM Scope Withdrawal Time: 0 hours 7 minutes 3 seconds  Total Procedure Duration: 0 hours 11 minutes 46 seconds  Estimated Blood Loss:  Estimated blood loss: none.      [facility]

## 2019-12-14 NOTE — Op Note (Signed)
[facility] Gastroenterology Patient Name: Megan Maxwell Procedure Date: 12/14/2019 10:03 AM MRN: [id number] Account #: [id number] Date of Birth: 07/31/1965 Admit Type: Outpatient Age: 54 Room: [facility] ENDO ROOM 1 Gender: Female Note Status: Finalized Procedure:             Upper GI endoscopy Indications:           Unexplained iron deficiency anemia Providers:             Lin Landsman MD, MD Referring MD:          Rupal L. Tasia Catchings MD (Referring MD) Medicines:             Monitored Anesthesia Care Complications:         No immediate complications. Estimated blood loss: None. Procedure:             Pre-Anesthesia Assessment:                        - Prior to the procedure, a History and Physical was                         performed, and patient medications and allergies were                         reviewed. The patient is competent. The risks and                         benefits of the procedure and the sedation options and                         risks were discussed with the patient. All questions                         were answered and informed consent was obtained.                         Patient identification and proposed procedure were                         verified by the physician, the nurse, the                         anesthesiologist, the anesthetist and the technician                         in the pre-procedure area in the procedure room in the                         endoscopy suite. Mental Status Examination: alert and                         oriented. Airway Examination: normal oropharyngeal                         airway and neck mobility. Respiratory Examination:                         clear to auscultation. CV Examination: normal.  Prophylactic Antibiotics: The patient does not require                         prophylactic antibiotics. Prior Anticoagulants: The                         patient has taken no previous  anticoagulant or                         antiplatelet agents. ASA Grade Assessment: II - A                         patient with mild systemic disease. After reviewing                         the risks and benefits, the patient was deemed in                         satisfactory condition to undergo the procedure. The                         anesthesia plan was to use monitored anesthesia care                         (MAC). Immediately prior to administration of                         medications, the patient was re-assessed for adequacy                         to receive sedatives. The heart rate, respiratory                         rate, oxygen saturations, blood pressure, adequacy of                         pulmonary ventilation, and response to care were                         monitored throughout the procedure. The physical                         status of the patient was re-assessed after the                         procedure.                        After obtaining informed consent, the endoscope was                         passed under direct vision. Throughout the procedure,                         the patient's blood pressure, pulse, and oxygen                         saturations were monitored continuously. The Endoscope  was introduced through the mouth, and advanced to the                         second part of duodenum. The upper GI endoscopy was                         accomplished without difficulty. The patient tolerated                         the procedure well. Findings:      The examined duodenum was normal. Biopsies for histology were taken with       a cold forceps for evaluation of celiac disease.      The entire examined stomach was normal. Biopsies were taken with a cold       forceps for Helicobacter pylori testing.      Esophagogastric landmarks were identified: the gastroesophageal junction       was found at 35 cm from the incisors.       The gastroesophageal junction and examined esophagus were normal. Impression:            - Normal examined duodenum. Biopsied.                        - Normal stomach. Biopsied.                        - Esophagogastric landmarks identified.                        - Normal gastroesophageal junction and esophagus. Recommendation:        - Await pathology results.                        - Proceed with colonoscopy as scheduled                        See colonoscopy report Procedure Code(s):     --- Professional ---                        [phone], Esophagogastroduodenoscopy, flexible,                         transoral; with biopsy, single or multiple Diagnosis Code(s):     --- Professional ---                        D50.9, Iron deficiency anemia, unspecified CPT copyright 2019 American Medical Association. All rights reserved. The codes documented in this report are preliminary and upon coder review may  be revised to meet current compliance requirements. Dr. Ulyess Mort Lin Landsman MD, MD 12/14/2019 10:14:59 AM This report has been signed electronically. Number of Addenda: 0 Note Initiated On: 12/14/2019 10:03 AM Estimated Blood Loss:  Estimated blood loss: none.      [facility]

## 2019-12-14 NOTE — H&P (Signed)
Arlyss Repress, MD [address]  [address]  [place], [place] [address]  Main: [phone]  Fax: [phone] Pager: [phone]  Primary Care Physician:  Zandra Abts, MD Primary Gastroenterologist:  Dr. Arlyss Repress  Pre-Procedure History & Physical: HPI:  Megan Maxwell is a 54 y.o. female is here for an endoscopy and colonoscopy.   Past Medical History:  Diagnosis Date  . Anemia   . Diabetes mellitus without complication [facility])     Past Surgical History:  Procedure Laterality Date  . c-section x2  1991/2001  . CHOLECYSTECTOMY      Prior to Admission medications   Medication Sig Start Date End Date Taking? Authorizing Provider  losartan (COZAAR) 25 MG tablet Take 25 mg by mouth daily. 07/27/19   [provider]  lovastatin (MEVACOR) 40 MG tablet SMARTSIG:1 Tablet(s) By Mouth Every Evening 11/16/19   [provider]  metFORMIN (GLUCOPHAGE) 1000 MG tablet Take 1,000 mg by mouth 2 (two) times daily. 08/31/19   [provider]  ondansetron (ZOFRAN-ODT) 4 MG disintegrating tablet Take 4 mg by mouth every 8 (eight) hours as needed. Patient not taking: Reported on 11/23/2019 11/04/19   [provider]    Allergies as of 11/19/2019  . (No Known Allergies)    Family History  Problem Relation Age of Onset  . Breast cancer Neg Hx   . Kidney cancer Neg Hx   . Prostate cancer Neg Hx   . Bladder Cancer Neg Hx     Social History   Socioeconomic History  . Marital status: Married    Spouse name: Not on file  . Number of children: Not on file  . Years of education: Not on file  . Highest education level: Not on file  Occupational History  . Not on file  Tobacco Use  . Smoking status: Never Smoker  . Smokeless tobacco: Never Used  Vaping Use  . Vaping Use: Never used  Substance and Sexual Activity  . Alcohol use: Never  . Drug use: Never  . Sexual activity: Not on file  Other Topics Concern  . Not on file  Social  History Narrative  . Not on file   Social Determinants of Health   Financial Resource Strain:   . Difficulty of Paying Living Expenses:   Food Insecurity:   . Worried About Programme researcher, broadcasting/film/video in the Last Year:   . Barista in the Last Year:   Transportation Needs:   . Freight forwarder (Medical):   Marland Kitchen Lack of Transportation (Non-Medical):   Physical Activity:   . Days of Exercise per Week:   . Minutes of Exercise per Session:   Stress:   . Feeling of Stress :   Social Connections:   . Frequency of Communication with Friends and Family:   . Frequency of Social Gatherings with Friends and Family:   . Attends Religious Services:   . Active Member of Clubs or Organizations:   . Attends Banker Meetings:   Marland Kitchen Marital Status:   Intimate Partner Violence:   . Fear of Current or Ex-Partner:   . Emotionally Abused:   Marland Kitchen Physically Abused:   . Sexually Abused:     Review of Systems: See HPI, otherwise negative ROS  Physical Exam: BP 136/61   Pulse 75   Temp (!) 96.9 F (36.1 C) (Temporal)   Resp 15   Ht 5\' 5"  (1.651 m)   Wt 78.9 kg  SpO2 100%   BMI 28.96 kg/m  General:   Alert,  pleasant and cooperative in NAD Head:  Normocephalic and atraumatic. Neck:  Supple; no masses or thyromegaly. Lungs:  Clear throughout to auscultation.    Heart:  Regular rate and rhythm. Abdomen:  Soft, nontender and nondistended. Normal bowel sounds, without guarding, and without rebound.   Neurologic:  Alert and  oriented x4;  grossly normal neurologically.  Impression/Plan: Megan Maxwell is here for an endoscopy and colonoscopy to be performed for IDA  Risks, benefits, limitations, and alternatives regarding  endoscopy and colonoscopy have been reviewed with the patient.  Questions have been answered.  All parties agreeable.   Lannette Donath, MD  12/14/2019, 9:00 AM

## 2019-12-15 NOTE — Anesthesia Postprocedure Evaluation (Signed)
Anesthesia Post Note  Patient: Megan Maxwell  Procedure(s) Performed: COLONOSCOPY WITH PROPOFOL (N/A ) ESOPHAGOGASTRODUODENOSCOPY (EGD) WITH PROPOFOL (N/A )  Patient location during evaluation: PACU Anesthesia Type: General Level of consciousness: awake and alert Pain management: pain level controlled Vital Signs Assessment: post-procedure vital signs reviewed and stable Respiratory status: spontaneous breathing, nonlabored ventilation and respiratory function stable Cardiovascular status: blood pressure returned to baseline and stable Postop Assessment: no apparent nausea or vomiting Anesthetic complications: no   No complications documented.   Last Vitals:  Vitals:   12/14/19 1050 12/14/19 1100  BP: (!) 145/70 (!) 145/70  Pulse: (!) 54 62  Resp: 12 15  Temp:    SpO2: 100% 100%    Last Pain:  Vitals:   12/14/19 1030  TempSrc: Temporal  PainSc:                  Karleen Hampshire

## 2019-12-16 ENCOUNTER — Encounter: Payer: Self-pay | Admitting: Gastroenterology

## 2019-12-16 LAB — SURGICAL PATHOLOGY

## 2020-02-25 ENCOUNTER — Ambulatory Visit: Payer: BLUE CROSS/BLUE SHIELD | Admitting: Gastroenterology

## 2020-02-25 ENCOUNTER — Encounter: Payer: Self-pay | Admitting: *Deleted

## 2022-01-02 IMAGING — MG DIGITAL DIAGNOSTIC BILAT W/ TOMO W/ CAD
6 of 10 series · 6 of 30 positions shown · non-contrast
Comparison: Baseline screening mammogram dated 10/22/2019.

CLINICAL DATA: Patient was called back from screening mammogram for
a possible mass in each breast.

EXAM:
DIGITAL DIAGNOSTIC BILATERAL MAMMOGRAM WITH CAD AND TOMO
ULTRASOUND BILATERAL BREAST

[L MLO synth-2D]
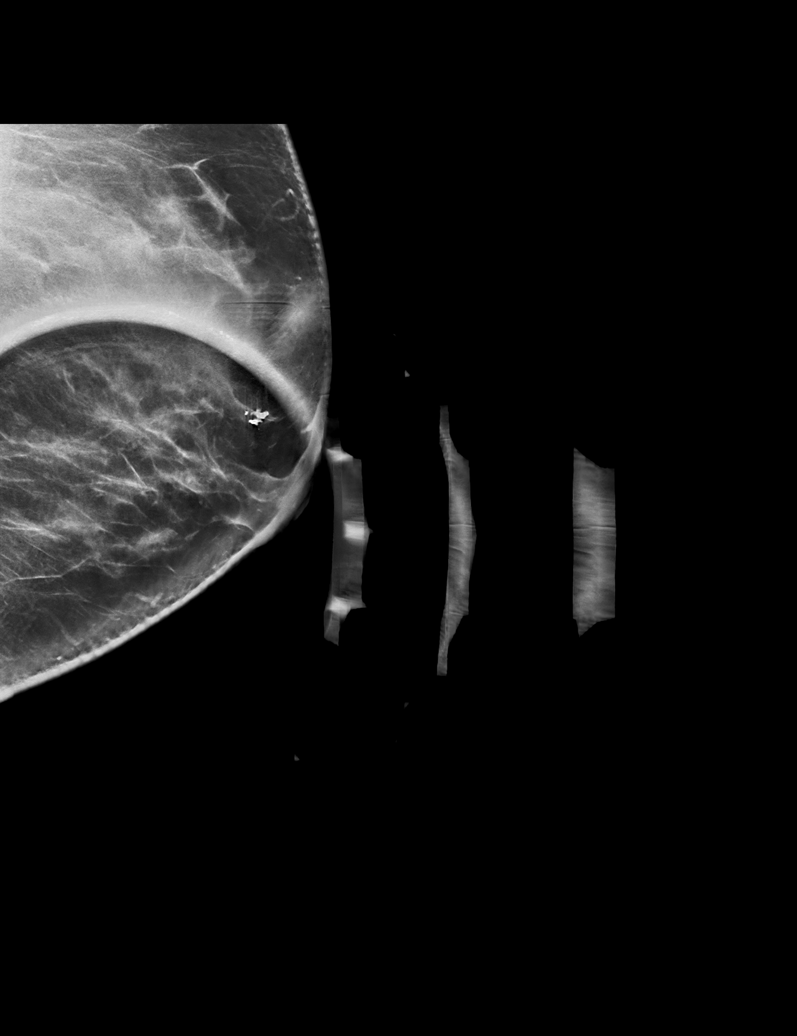

[R MLO synth-2D]
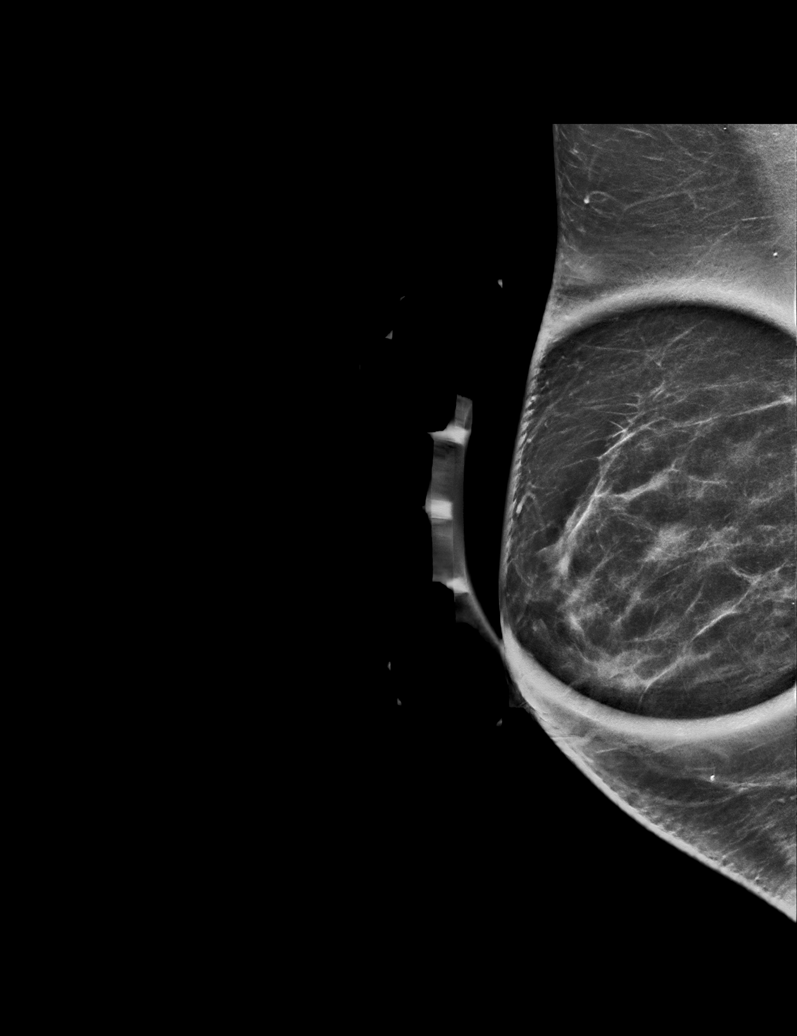

[L CC synth-2D]
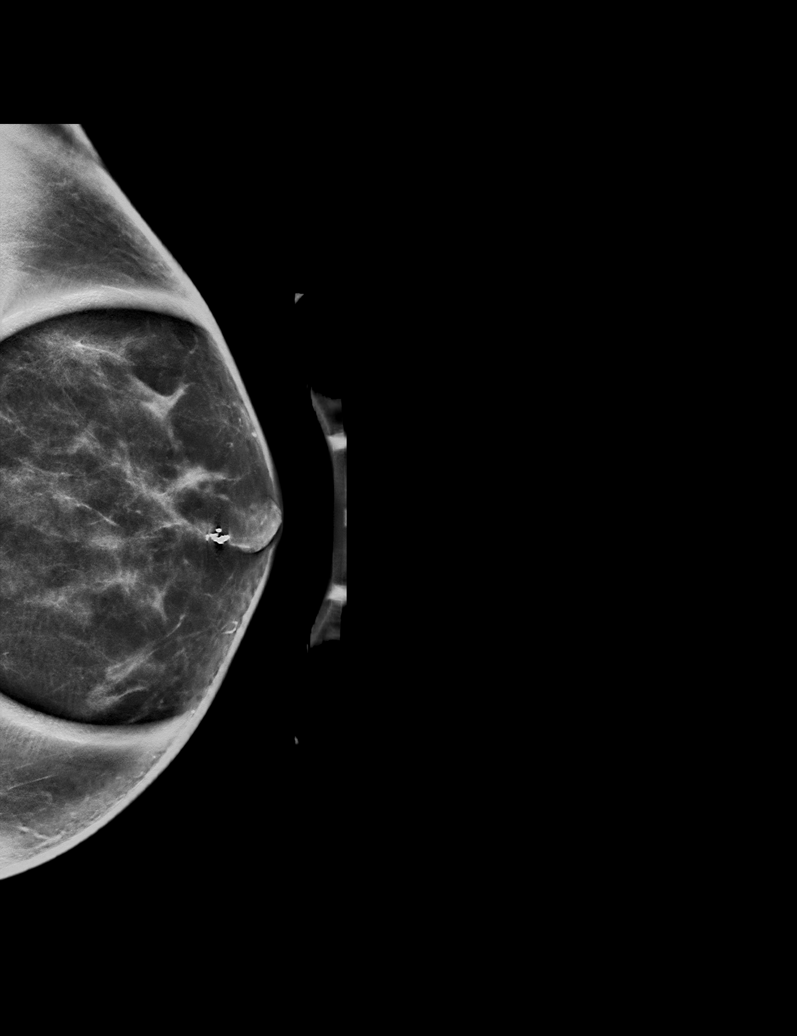

[R CC synth-2D]
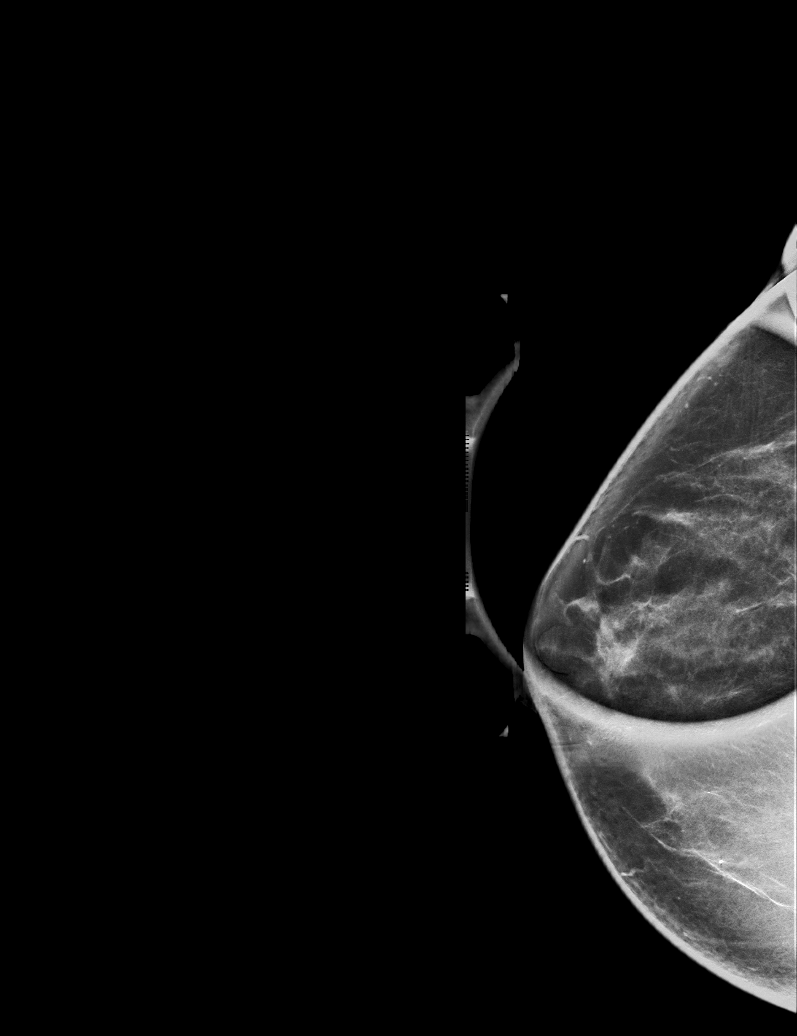

[L ML synth-2D]
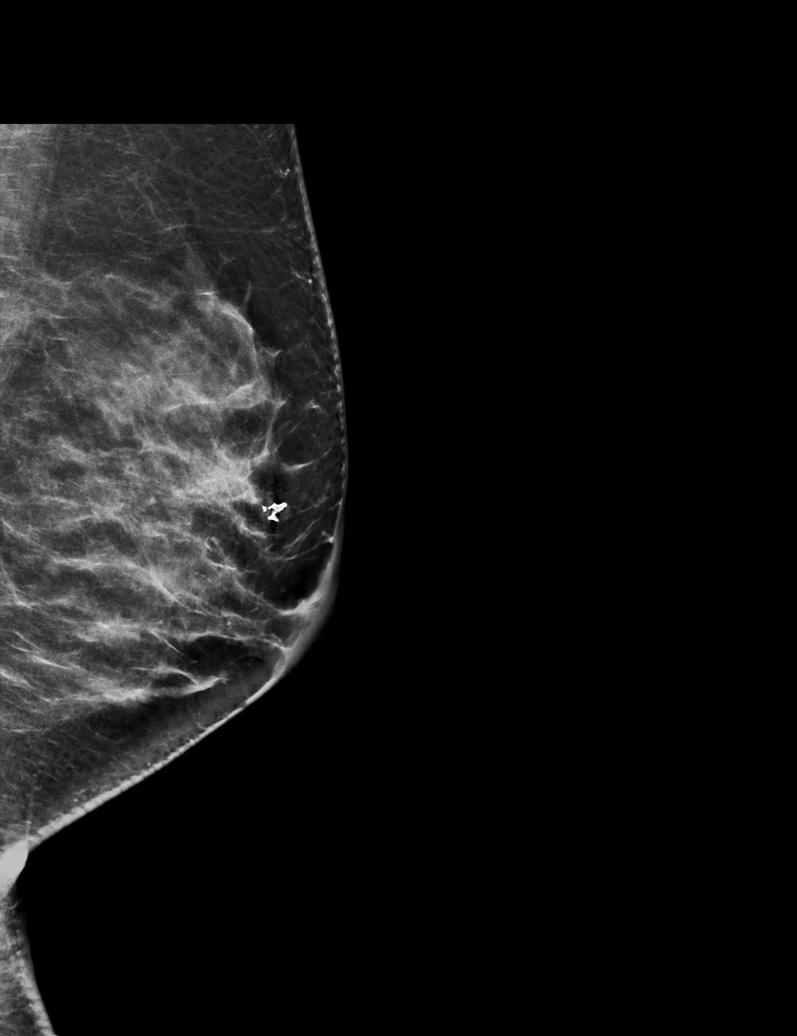

[R CC tomo · tomo slice 31/62.0]
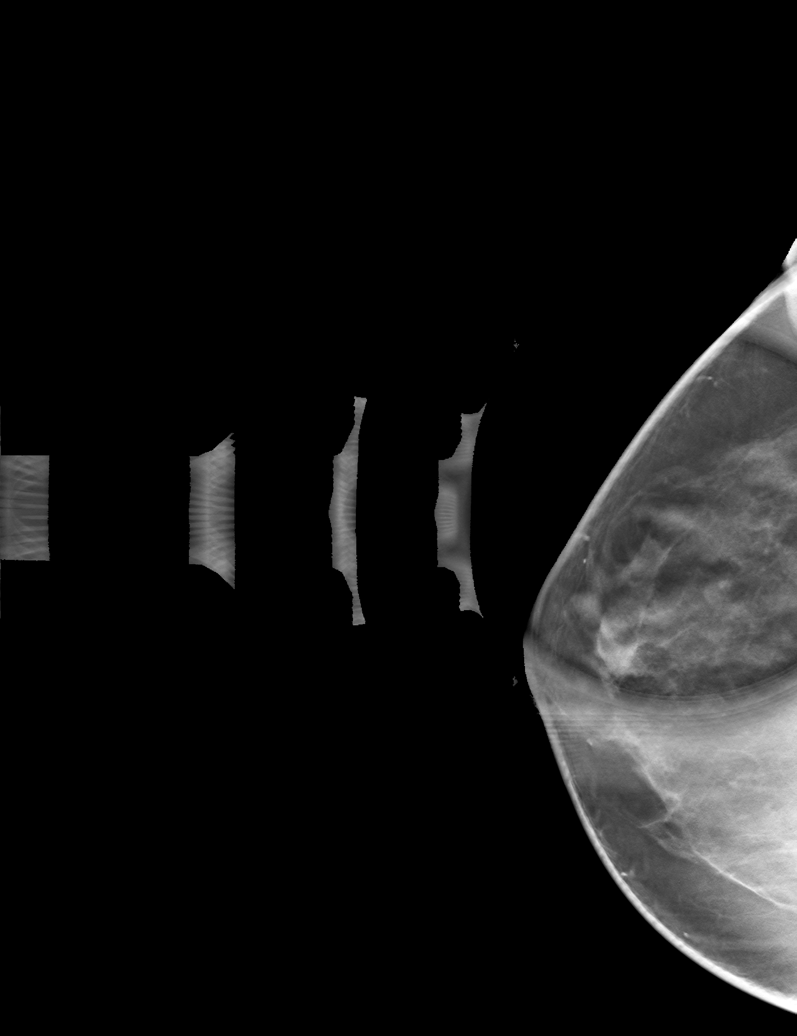

[6 of 30 positions shown; findings below may reference images not displayed]

ACR Breast Density Category c: The breast tissue is heterogeneously
dense, which may obscure small masses.
FINDINGS: Additional imaging of each breast was obtained. There is persistence
of a low-density 5 mm mass in the upper-outer quadrant of the right
breast and a obscured low-density 4 mm mass in the lower outer
quadrant of the left breast. There are no malignant type
microcalcifications.

Mammographic images were processed with CAD.

Targeted ultrasound is performed, showing a well-circumscribed
hypoechoic mass in the right breast at 10 o'clock 5 cm from the
nipple measuring 6 x 2 x 5 mm. It is felt to likely be a complex
cyst or benign fibroadenoma. Sonographic evaluation of the left
breast shows an anechoic cyst at 5 o'clock 3 cm from the nipple
measuring 4 x 4 x 5 mm.
IMPRESSION: Probable benign it complex cyst in the 10 o'clock region of the
right breast. Benign appearing cyst in the 5 o'clock region of the
left breast.

RECOMMENDATION:
Short-term interval follow-up right mammogram and ultrasound in 6
months is recommended.

I have discussed the findings and recommendations with the patient.
If applicable, a reminder letter will be sent to the patient
regarding the next appointment.

BI-RADS CATEGORY  3: Probably benign.

## 2022-02-04 ENCOUNTER — Encounter: Payer: Self-pay | Admitting: Internal Medicine

## 2022-02-04 ENCOUNTER — Other Ambulatory Visit: Payer: Self-pay | Admitting: Family Medicine

## 2022-02-04 DIAGNOSIS — Z1231 Encounter for screening mammogram for malignant neoplasm of breast: Secondary | ICD-10-CM

## 2022-02-06 ENCOUNTER — Ambulatory Visit
Admission: RE | Admit: 2022-02-06 | Discharge: 2022-02-06 | Disposition: A | Discharge status: Home / Self Care | Payer: BC Managed Care – PPO | Source: Ambulatory Visit | Attending: Family Medicine | Admitting: Family Medicine

## 2022-02-06 ENCOUNTER — Encounter: Payer: Self-pay | Admitting: Internal Medicine

## 2022-02-06 DIAGNOSIS — Z1231 Encounter for screening mammogram for malignant neoplasm of breast: Secondary | ICD-10-CM

## 2022-02-15 ENCOUNTER — Other Ambulatory Visit: Payer: Self-pay | Admitting: Family Medicine

## 2022-02-19 ENCOUNTER — Other Ambulatory Visit: Payer: Self-pay | Admitting: Family Medicine

## 2022-02-19 DIAGNOSIS — N6001 Solitary cyst of right breast: Secondary | ICD-10-CM

## 2022-11-01 LAB — COLOGUARD: COLOGUARD: NEGATIVE

## 2024-05-28 ENCOUNTER — Other Ambulatory Visit: Payer: Self-pay | Admitting: Physician Assistant

## 2024-05-28 DIAGNOSIS — N281 Cyst of kidney, acquired: Secondary | ICD-10-CM

## 2024-06-09 ENCOUNTER — Encounter: Payer: Self-pay | Admitting: Internal Medicine

## 2024-06-11 ENCOUNTER — Ambulatory Visit: Admission: RE | Admit: 2024-06-11

## 2024-06-11 DIAGNOSIS — N281 Cyst of kidney, acquired: Secondary | ICD-10-CM
